# Patient Record
Sex: Male | Born: 1955 | Race: White | Hispanic: No | Marital: Married | Smoking: Current every day smoker
Health system: Southern US, Community
[De-identification: ages and names within clinical notes are randomized; demographics above are authoritative.]

## PROBLEM LIST (undated history)

## (undated) DIAGNOSIS — M722 Plantar fascial fibromatosis: Secondary | ICD-10-CM

## (undated) DIAGNOSIS — I429 Cardiomyopathy, unspecified: Secondary | ICD-10-CM

## (undated) DIAGNOSIS — I499 Cardiac arrhythmia, unspecified: Secondary | ICD-10-CM

## (undated) DIAGNOSIS — I4891 Unspecified atrial fibrillation: Secondary | ICD-10-CM

## (undated) DIAGNOSIS — E785 Hyperlipidemia, unspecified: Secondary | ICD-10-CM

## (undated) DIAGNOSIS — I10 Essential (primary) hypertension: Secondary | ICD-10-CM

## (undated) HISTORY — DX: Essential (primary) hypertension: I10

## (undated) HISTORY — PX: CARDIOVERSION: SHX1299

## (undated) HISTORY — DX: Hyperlipidemia, unspecified: E78.5

## (undated) HISTORY — PX: FOOT SURGERY: SHX648

## (undated) HISTORY — DX: Plantar fascial fibromatosis: M72.2

## (undated) HISTORY — DX: Cardiomyopathy, unspecified: I42.9

## (undated) HISTORY — PX: APPENDECTOMY: SHX54

## (undated) HISTORY — DX: Unspecified atrial fibrillation: I48.91

---

## 2015-04-12 ENCOUNTER — Other Ambulatory Visit: Payer: Self-pay | Admitting: Gastroenterology

## 2015-04-12 ENCOUNTER — Encounter: Payer: Self-pay | Admitting: Cardiology

## 2015-04-12 ENCOUNTER — Ambulatory Visit (INDEPENDENT_AMBULATORY_CARE_PROVIDER_SITE_OTHER): Payer: Managed Care, Other (non HMO) | Admitting: Cardiology

## 2015-04-12 VITALS — BP 110/70 | HR 54 | Ht 71.0 in | Wt 194.1 lb

## 2015-04-12 DIAGNOSIS — I480 Paroxysmal atrial fibrillation: Secondary | ICD-10-CM | POA: Insufficient documentation

## 2015-04-12 DIAGNOSIS — E785 Hyperlipidemia, unspecified: Secondary | ICD-10-CM | POA: Diagnosis not present

## 2015-04-12 DIAGNOSIS — Z720 Tobacco use: Secondary | ICD-10-CM | POA: Insufficient documentation

## 2015-04-12 DIAGNOSIS — Z8679 Personal history of other diseases of the circulatory system: Secondary | ICD-10-CM | POA: Insufficient documentation

## 2015-04-12 NOTE — Progress Notes (Signed)
Cardiology Office Note   Date:  04/12/2015   ID:  Benjamin Myers, DOB August 26, 1956, MRN [id number]  PCP:  Wenda Low, MD  Cardiologist:   Candee Furbish, MD   Evaluation of A. fib    History of Present Illness: Benjamin Myers is a 59 y.o. 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 He has been on Tikosyn and Xarelto since 2013. Also has hypertension, hyperlipidemia. Current smoker. Works at [employer]. He sets a boat shows etc. Born in [place].   They have 3 dogs, Beagle, Atlantic, bordercollie who is deaf.  Since 2013 he has been on Xarelto and Tikosyn. He has been doing very well. He has not had another episode of atrial fibrillation. He does smoke however.  When he had his episode of atrial fibrillation 2013 his heart rate was 200 beats a minute. He was in the hospital for 5 days. He finally ended up getting a cardioversion electrically.    Past Medical History  Diagnosis Date  . HTN (hypertension)   . Hyperlipidemia   . A-fib   . Cardiomyopathy   . Plantar fasciitis   . A-fib     Past Surgical History  Procedure Laterality Date  . Cardioversion    . Foot surgery    . Appendectomy       Current Outpatient Prescriptions  Medication Sig Dispense Refill  . carvedilol (COREG) 12.5 MG tablet Take 12.5 mg by mouth 2 (two) times daily with a meal.    . dofetilide (TIKOSYN) 250 MCG capsule Take 250 mcg by mouth 2 (two) times daily.    Marland Kitchen lisinopril (PRINIVIL,ZESTRIL) 10 MG tablet Take 10 mg by mouth daily.    . rivaroxaban (XARELTO) 20 MG TABS tablet Take 20 mg by mouth daily with supper.    . simvastatin (ZOCOR) 20 MG tablet Take 20 mg by mouth daily.     No current facility-administered medications for this visit.     Allergies:   Review of patient's allergies indicates no known allergies.    Social History:  The patient  reports that he has been smoking.  He does not have any smokeless tobacco history on file.   Family History:  The patient's family history includes Hypertension in his mother.    ROS:  Please see the history of present illness.   Otherwise, review of systems are positive for none.   All other systems are reviewed and negative.    PHYSICAL EXAM: VS:  BP 110/70 mmHg  Pulse 54  Ht 5\' 11"  (1.803 m)  Wt 194 lb 1.9 oz (88.052 kg)  BMI 27.09 kg/m2 , BMI Body mass index is 27.09 kg/(m^2). GEN: Well nourished, well developed, in no acute distress HEENT: normal Neck: no JVD, carotid bruits, or masses Cardiac: RRR; no murmurs, rubs, or gallops,no edema  Respiratory:  clear to auscultation bilaterally, normal work of breathing GI: soft, nontender, nondistended, + BS MS: no deformity or atrophy Skin: warm and dry, no rash Neuro:  Strength and sensation are intact Psych: euthymic mood, full affect   EKG:  EKG is ordered today. The ekg ordered today demonstrates 04/12/15-sinus bradycardia rate 54 with no other abnormalities.  Labs: Hemoglobin 14.1, platelets 244, sodium 137, potassium 4.2, ALT 13, creatinine 0.76. Calculated LDL 66, triglycerides 118, HDL 42  Recent Labs: No results found for requested labs within last 365 days.    Lipid Panel No results found for: CHOL, TRIG, HDL, CHOLHDL, VLDL, LDLCALC, LDLDIRECT    Wt Readings from Last 3 Encounters:  04/12/15 194 lb 1.9 oz (88.052 kg)      Other studies Reviewed: Additional studies/ records that were reviewed today include: Office notes, lab work, EKG. Review of the above records demonstrates: As above   ASSESSMENT AND PLAN:  1.  Paroxysmal atrial fibrillation-CHADS-VASc 1 (HTN). He has been on Xarelto for 3 years. We will continue with this. I explained to him the option of anticoagulation or no anticoagulation.  Risks and benefits of bleeding, stroke. He has had no bleeding episodes. He gets his Xarelto for free.  2. Previous tachycardia-induced cardiomyopathy-ejection fraction improved from 20% to 60%. Continue with current medications.  3. Hyperlipidemia-LDL in the 60s. Continue simvastatin.  4. Smoking-encourage tobacco cessation. He was able to quit after the hospitalization. Many of his coworkers smoke. Challenging.   Current medicines are reviewed at length with the patient today.  The patient does not have concerns regarding medicines.  The following changes have been made:  no change  Labs/ tests ordered today include: none  No orders of the defined types were placed in this encounter.     Disposition:   FU with Hydie Langan in 6 months  Signed, Candee Furbish, MD  04/12/2015 9:44 AM    [facility] [place], [place], [place]  [address] Phone: [phone]; Fax: [phone]

## 2015-04-12 NOTE — Patient Instructions (Signed)
Medication Instructions:  Your physician recommends that you continue on your current medications as directed. Please refer to the Current Medication list given to you today.  Follow-Up: Follow up in 6 months with Dr. Skains.  You will receive a letter in the mail 2 months before you are due.  Please call us when you receive this letter to schedule your follow up appointment.  Thank you for choosing [facility]!!     

## 2015-04-27 ENCOUNTER — Telehealth: Payer: Self-pay | Admitting: Cardiology

## 2015-04-27 NOTE — Telephone Encounter (Signed)
ROI faxed to [facility], records rec back placed in chart rep bin.

## 2015-07-20 ENCOUNTER — Encounter (HOSPITAL_COMMUNITY): Payer: Self-pay | Admitting: *Deleted

## 2015-08-06 ENCOUNTER — Encounter (HOSPITAL_COMMUNITY)
Admission: RE | Disposition: A | Discharge status: Home / Self Care | Payer: Self-pay | Source: Ambulatory Visit | Attending: Gastroenterology

## 2015-08-06 ENCOUNTER — Ambulatory Visit (HOSPITAL_COMMUNITY): Payer: Managed Care, Other (non HMO) | Admitting: Certified Registered Nurse Anesthetist

## 2015-08-06 ENCOUNTER — Ambulatory Visit (HOSPITAL_COMMUNITY)
Admission: RE | Admit: 2015-08-06 | Discharge: 2015-08-06 | Disposition: A | Discharge status: Home / Self Care | Payer: Managed Care, Other (non HMO) | Source: Ambulatory Visit | Attending: Gastroenterology | Admitting: Gastroenterology

## 2015-08-06 ENCOUNTER — Encounter (HOSPITAL_COMMUNITY): Payer: Self-pay

## 2015-08-06 DIAGNOSIS — I429 Cardiomyopathy, unspecified: Secondary | ICD-10-CM | POA: Diagnosis not present

## 2015-08-06 DIAGNOSIS — D128 Benign neoplasm of rectum: Secondary | ICD-10-CM | POA: Insufficient documentation

## 2015-08-06 DIAGNOSIS — I482 Chronic atrial fibrillation: Secondary | ICD-10-CM | POA: Insufficient documentation

## 2015-08-06 DIAGNOSIS — Z79899 Other long term (current) drug therapy: Secondary | ICD-10-CM | POA: Diagnosis not present

## 2015-08-06 DIAGNOSIS — Z1211 Encounter for screening for malignant neoplasm of colon: Secondary | ICD-10-CM | POA: Diagnosis not present

## 2015-08-06 DIAGNOSIS — E780 Pure hypercholesterolemia: Secondary | ICD-10-CM | POA: Diagnosis not present

## 2015-08-06 DIAGNOSIS — F17200 Nicotine dependence, unspecified, uncomplicated: Secondary | ICD-10-CM | POA: Insufficient documentation

## 2015-08-06 DIAGNOSIS — Z7901 Long term (current) use of anticoagulants: Secondary | ICD-10-CM | POA: Diagnosis not present

## 2015-08-06 DIAGNOSIS — I10 Essential (primary) hypertension: Secondary | ICD-10-CM | POA: Insufficient documentation

## 2015-08-06 HISTORY — DX: Cardiac arrhythmia, unspecified: I49.9

## 2015-08-06 HISTORY — PX: COLONOSCOPY WITH PROPOFOL: SHX5780

## 2015-08-06 SURGERY — COLONOSCOPY WITH PROPOFOL
Anesthesia: Monitor Anesthesia Care

## 2015-08-06 MED ORDER — LIDOCAINE HCL (CARDIAC) 20 MG/ML IV SOLN
INTRAVENOUS | Status: DC | PRN
  Administered 2015-08-06: 50 mg via INTRAVENOUS

## 2015-08-06 MED ORDER — LACTATED RINGERS IV SOLN
INTRAVENOUS | Status: DC
Start: 2015-08-06 — End: 2015-08-06
  Administered 2015-08-06: 09:00:00 via INTRAVENOUS

## 2015-08-06 MED ORDER — SODIUM CHLORIDE 0.9 % IV SOLN: INTRAVENOUS | Status: DC

## 2015-08-06 MED ORDER — PROPOFOL 10 MG/ML IV BOLUS
INTRAVENOUS | Status: AC
  Filled 2015-08-06: qty 20

## 2015-08-06 MED ORDER — LIDOCAINE HCL (CARDIAC) 20 MG/ML IV SOLN
INTRAVENOUS | Status: AC
  Filled 2015-08-06: qty 5

## 2015-08-06 MED ORDER — PROPOFOL 10 MG/ML IV BOLUS
INTRAVENOUS | Status: DC | PRN
  Administered 2015-08-06: 100 mg via INTRAVENOUS
  Administered 2015-08-06 (×2): 50 mg via INTRAVENOUS

## 2015-08-06 SURGICAL SUPPLY — 21 items

## 2015-08-06 NOTE — Op Note (Signed)
Procedure: Baseline screening colonoscopy. Xarelto anticoagulation therapy to treat atrial fibrillation. No family history of colon cancer.  Endoscopist: Earle Gell  Premedication: Propofol administered by anesthesia  Procedure: The patient was placed in the left lateral decubitus position. Anal inspection and digital rectal exam were normal. Pentax pediatric colonoscope was introduced into the rectum and advanced to the cecum. A normal-appearing appendiceal orifice was identified. A normal-appearing ileocecal valve was intubated and the terminal ileum inspected. Colonic preparation for the exam today was good. Withdrawal time was 13 minutes  Rectum. From the mid rectum, a 5 mm sessile polyp was removed with the cold snare and 10 Endo Clip was applied to the polypectomy to site to prevent bleeding. Retroflexed view of the distal rectum was normal  Sigmoid colon and descending colon. Normal  Splenic flexure. Normal  Transverse colon. Normal  Hepatic flexure. Normal  Ascending colon. Normal.  Cecum and ileocecal valve. Normal.  Terminal ileum. Normal  Assessment: From the mid rectum, a 5 mm sessile polyp was removed with the cold snare and an Endo Clip applied to the polypectomy site to prevent bleeding. Otherwise normal colonoscopy  Recommendation: If the rectal polyp returns adenomatous pathologically, schedule repeat colonoscopy in 5 years

## 2015-08-06 NOTE — Transfer of Care (Signed)
Immediate Anesthesia Transfer of Care Note  Patient: Benjamin Myers  Procedure(s) Performed: Procedure(s): COLONOSCOPY WITH PROPOFOL (N/A)  Patient Location: PACU  Anesthesia Type:MAC  Level of Consciousness: awake, alert  and oriented  Airway & Oxygen Therapy: Patient Spontanous Breathing and Patient connected to face mask oxygen  Post-op Assessment: Report given to RN and Post -op Vital signs reviewed and stable  Post vital signs: Reviewed and stable  Last Vitals:  Filed Vitals:   08/06/15 0905  BP: 175/84  Pulse: 58  Temp: 36.7 C  Resp: 14    Complications: No apparent anesthesia complications

## 2015-08-06 NOTE — Discharge Instructions (Signed)

## 2015-08-06 NOTE — Anesthesia Preprocedure Evaluation (Addendum)
Anesthesia Evaluation  Patient identified by MRN, date of birth, ID band Patient awake    Reviewed: Allergy & Precautions, H&P , NPO status , Patient's Chart, lab work & pertinent test results, reviewed documented beta blocker date and time   Airway Mallampati: II  TM Distance: >3 FB Neck ROM: full    Dental  (+) Dental Advisory Given, Poor Dentition Issues with top left front lateral teeth:   Pulmonary Current Smoker,    Pulmonary exam normal breath sounds clear to auscultation       Cardiovascular Exercise Tolerance: Good hypertension, Pt. on home beta blockers and Pt. on medications Normal cardiovascular exam+ dysrhythmias Atrial Fibrillation  Rhythm:regular Rate:Normal  History cardiomyopathy   Neuro/Psych negative neurological ROS  negative psych ROS   GI/Hepatic negative GI ROS, Neg liver ROS,   Endo/Other  negative endocrine ROS  Renal/GU negative Renal ROS  negative genitourinary   Musculoskeletal   Abdominal   Peds  Hematology negative hematology ROS (+)   Anesthesia Other Findings   Reproductive/Obstetrics negative OB ROS                            Anesthesia Physical Anesthesia Plan  ASA: III  Anesthesia Plan: MAC   Post-op Pain Management:    Induction:   Airway Management Planned:   Additional Equipment:   Intra-op Plan:   Post-operative Plan:   Informed Consent: I have reviewed the patients History and Physical, chart, labs and discussed the procedure including the risks, benefits and alternatives for the proposed anesthesia with the patient or authorized representative who has indicated his/her understanding and acceptance.   Dental Advisory Given  Plan Discussed with: CRNA and Surgeon  Anesthesia Plan Comments:         Anesthesia Quick Evaluation

## 2015-08-06 NOTE — H&P (Signed)
  Procedure: Baseline screening colonoscopy. Chronic anticoagulation with xarelto to treat chronic atrial fibrillation  History: The patient is a 59 year old male born 1956-03-31. He is scheduled to undergo his first screening colonoscopy with polypectomy to prevent colon cancer. He stopped taking xarelto 4 days ago.  Past medical history: Hypertension. Hypercholesterolemia. Paroxysmal atrial fibrillation. Cardiomyopathy. 2015 left ventricular ejection fraction 60% by cardiac echo. Foot surgery for plantar fascia Ida's. Appendectomy.  Medication allergies: None  Exam: The patient is alert and lying comfortably on the endoscopy stretcher. Abdomen is soft and nontender to palpation. Lungs are clear to auscultation. Cardiac exam reveals a regular rhythm.  Plan: Proceed with baseline screening colonoscopy

## 2015-08-06 NOTE — Anesthesia Postprocedure Evaluation (Signed)
  Anesthesia Post-op Note  Patient: Benjamin Myers  Procedure(s) Performed: Procedure(s) (LRB): COLONOSCOPY WITH PROPOFOL (N/A)  Patient Location: PACU  Anesthesia Type: MAC  Level of Consciousness: awake and alert   Airway and Oxygen Therapy: Patient Spontanous Breathing  Post-op Pain: mild  Post-op Assessment: Post-op Vital signs reviewed, Patient's Cardiovascular Status Stable, Respiratory Function Stable, Patent Airway and No signs of Nausea or vomiting  Last Vitals:  Filed Vitals:   08/06/15 1052  BP: 151/75  Pulse:   Temp:   Resp: 15    Post-op Vital Signs: stable   Complications: No apparent anesthesia complications

## 2015-08-07 ENCOUNTER — Encounter (HOSPITAL_COMMUNITY): Payer: Self-pay | Admitting: Gastroenterology

## 2015-10-16 ENCOUNTER — Ambulatory Visit: Payer: Managed Care, Other (non HMO) | Admitting: Cardiology

## 2017-04-22 ENCOUNTER — Other Ambulatory Visit: Payer: Self-pay | Admitting: Internal Medicine

## 2017-04-22 ENCOUNTER — Ambulatory Visit
Admission: RE | Admit: 2017-04-22 | Discharge: 2017-04-22 | Disposition: A | Discharge status: Home / Self Care | Payer: Managed Care, Other (non HMO) | Source: Ambulatory Visit | Attending: Internal Medicine | Admitting: Internal Medicine

## 2017-04-22 DIAGNOSIS — F17210 Nicotine dependence, cigarettes, uncomplicated: Secondary | ICD-10-CM

## 2018-06-10 IMAGING — DX DG CHEST 2V
2 series · 2 of 2 positions shown · non-contrast
Comparison: None.

CLINICAL DATA: Smoker

EXAM:
CHEST  2 VIEW

[dg chest 2 view (1 of 2)]
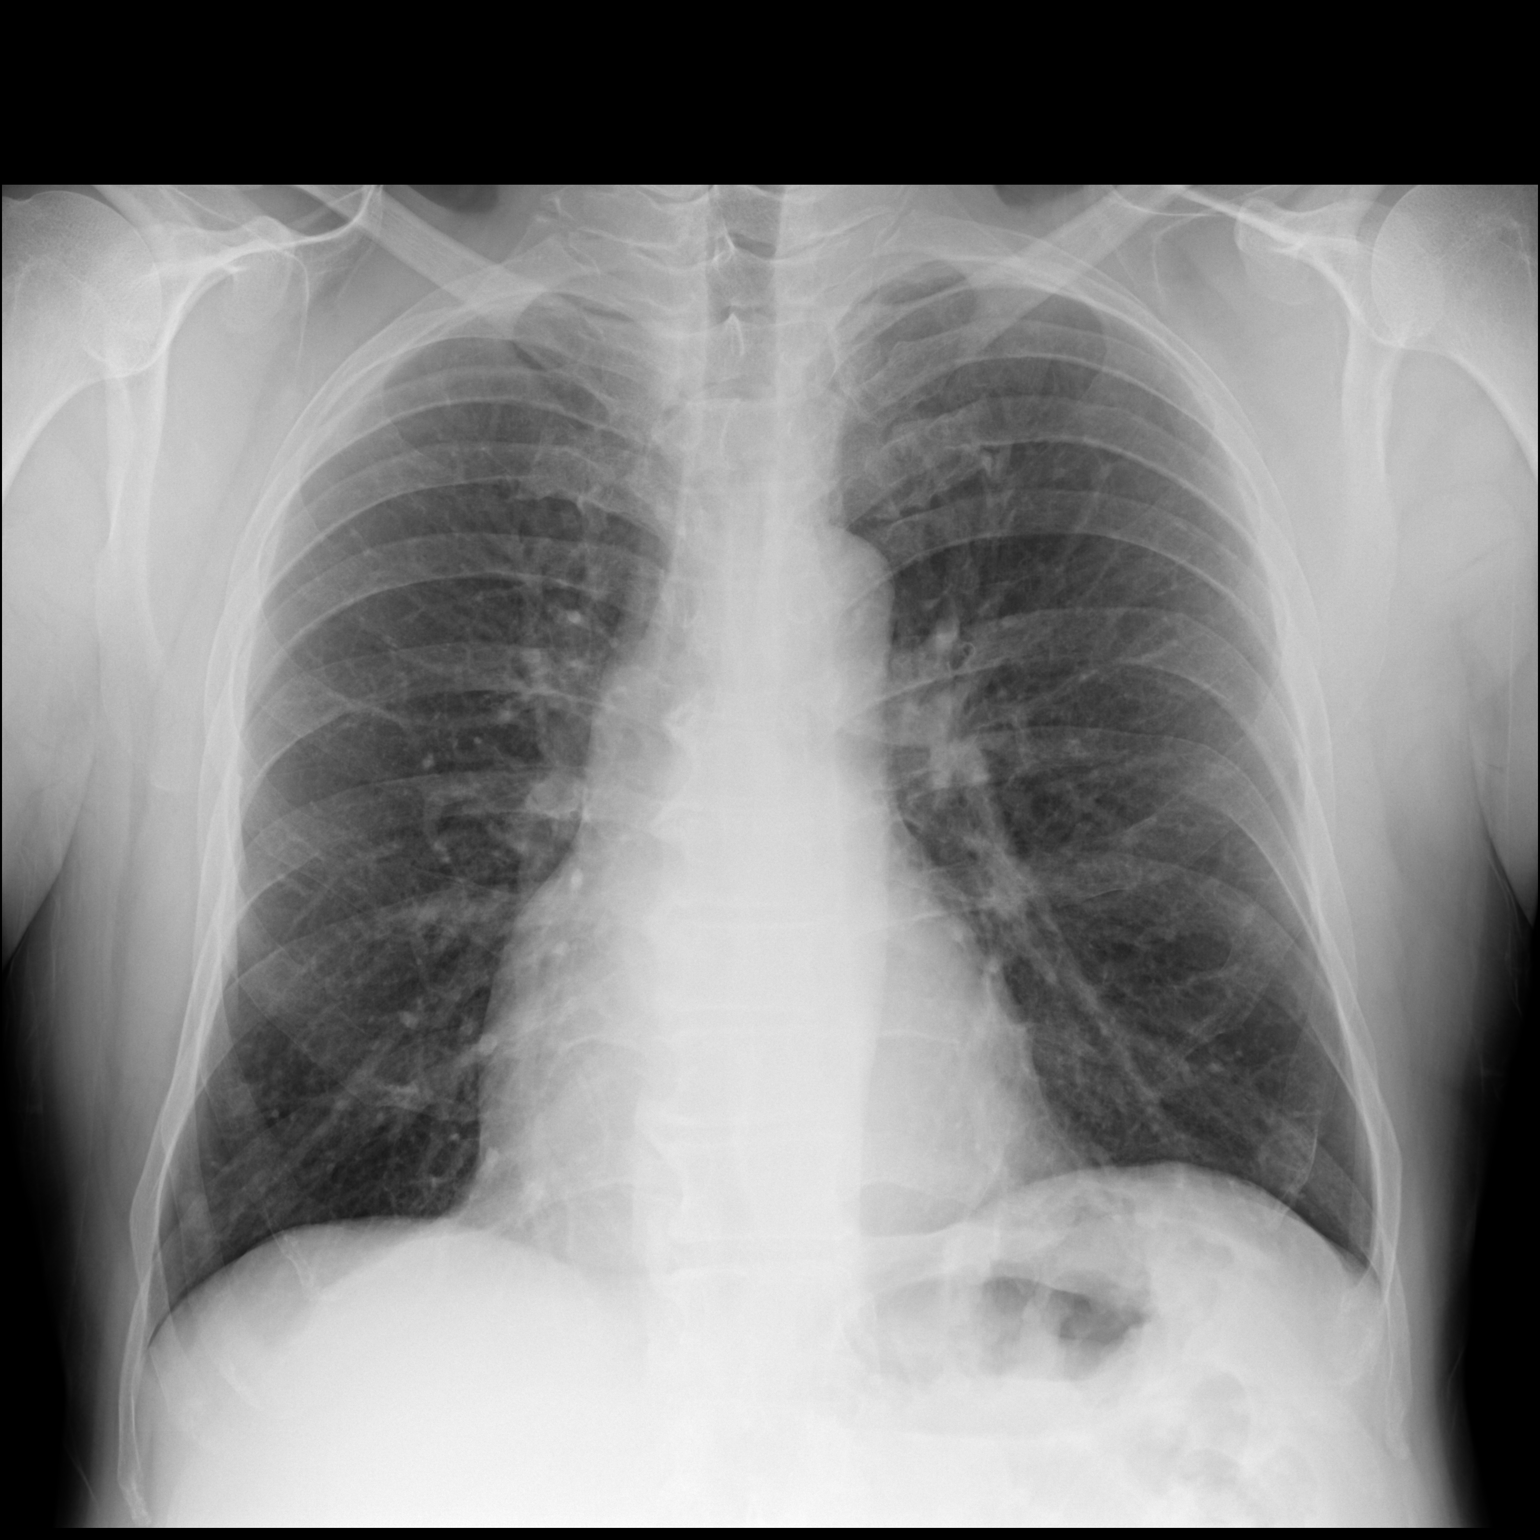

[dg chest 2 view (2 of 2)]
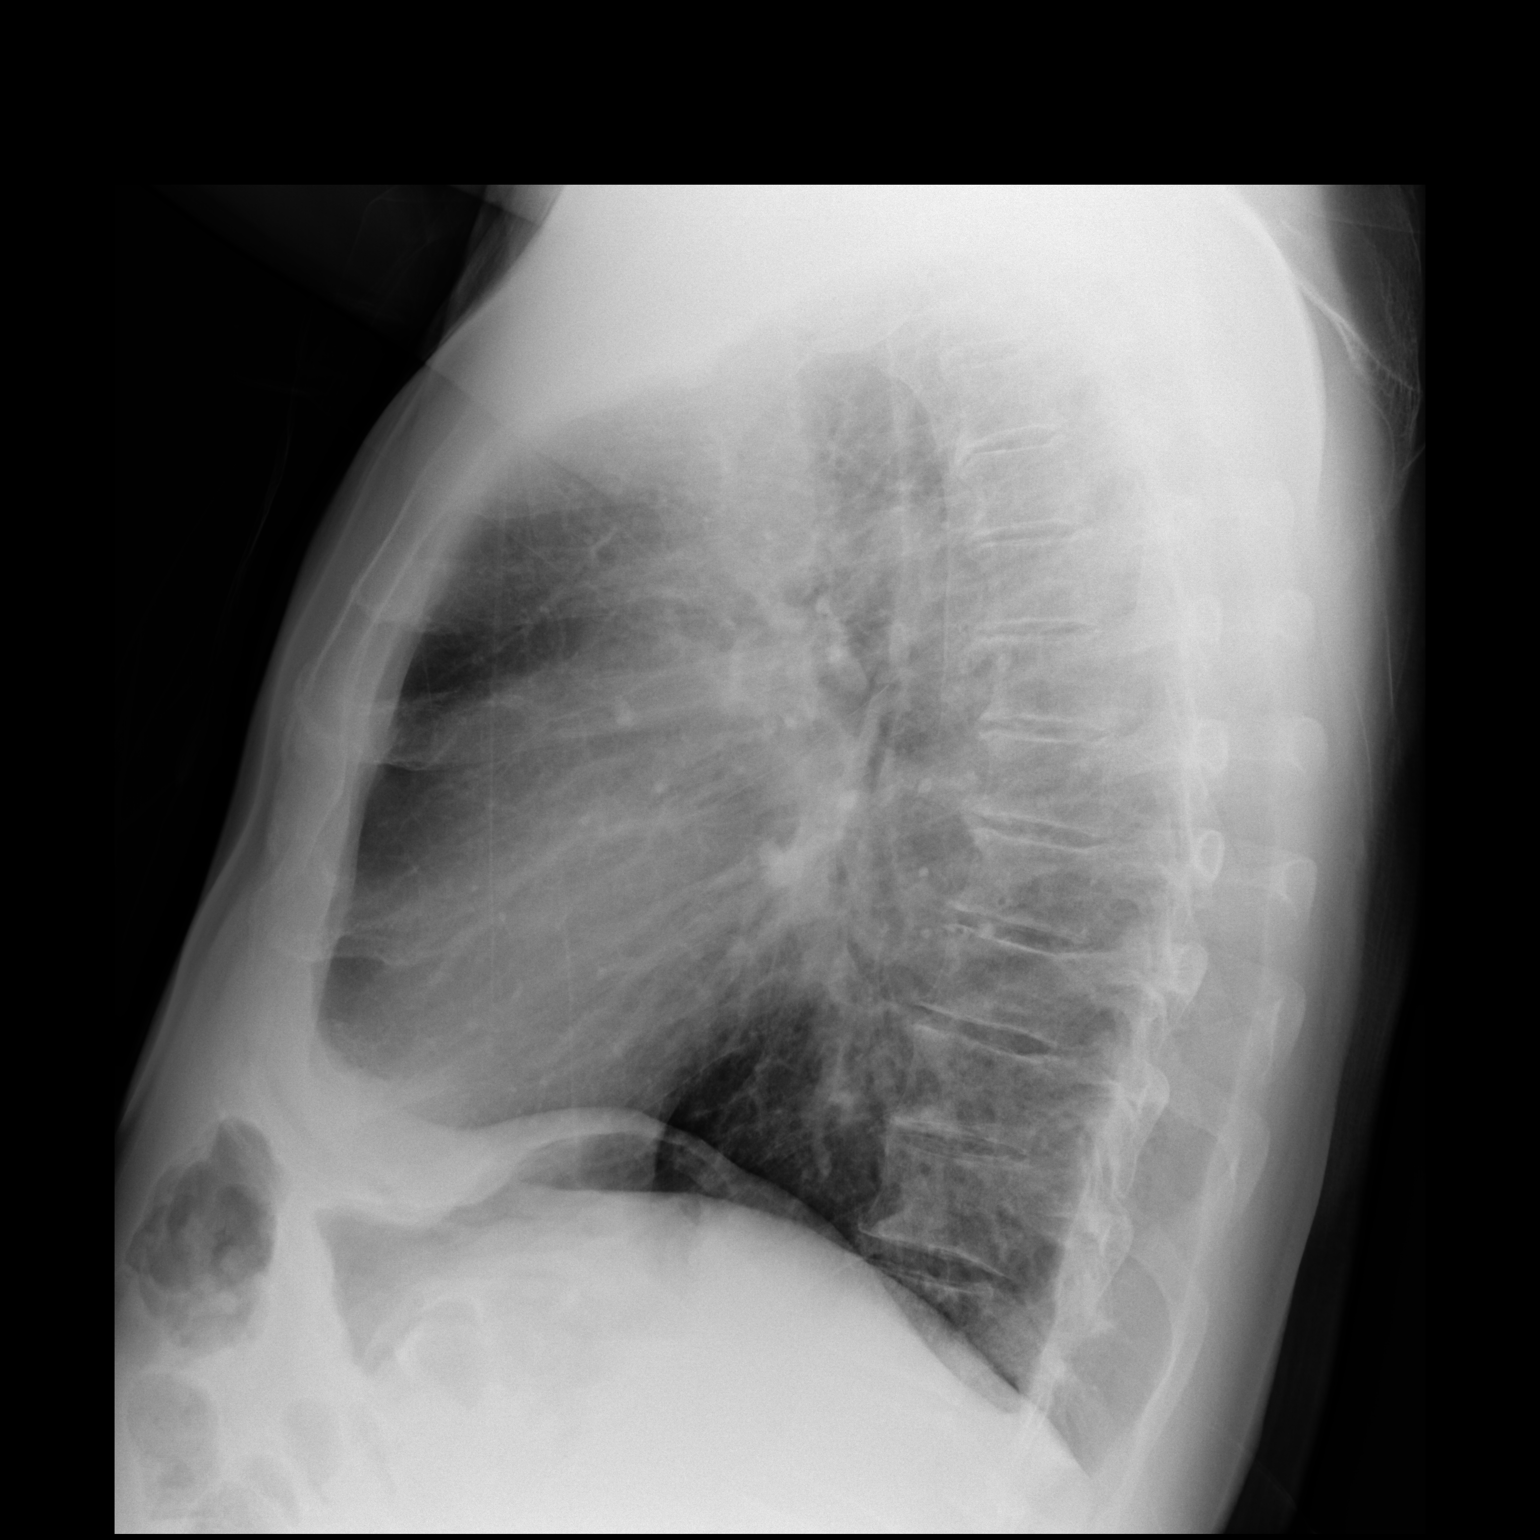

[2 of 2 positions shown; findings below may reference images not displayed]

FINDINGS: Mild hyperinflation. Heart and mediastinal contours are within
normal limits. No focal opacities or effusions. No acute bony
abnormality.
IMPRESSION: Mild hyperinflation.  No active cardiopulmonary disease.

## 2023-07-24 DIAGNOSIS — H40053 Ocular hypertension, bilateral: Secondary | ICD-10-CM | POA: Diagnosis not present

## 2023-07-24 DIAGNOSIS — H43393 Other vitreous opacities, bilateral: Secondary | ICD-10-CM | POA: Diagnosis not present

## 2023-07-24 DIAGNOSIS — H40033 Anatomical narrow angle, bilateral: Secondary | ICD-10-CM | POA: Diagnosis not present

## 2023-07-24 DIAGNOSIS — H25043 Posterior subcapsular polar age-related cataract, bilateral: Secondary | ICD-10-CM | POA: Diagnosis not present

## 2023-07-24 DIAGNOSIS — H25813 Combined forms of age-related cataract, bilateral: Secondary | ICD-10-CM | POA: Diagnosis not present

## 2023-08-24 DIAGNOSIS — H402223 Chronic angle-closure glaucoma, left eye, severe stage: Secondary | ICD-10-CM | POA: Diagnosis not present

## 2023-08-24 DIAGNOSIS — H40032 Anatomical narrow angle, left eye: Secondary | ICD-10-CM | POA: Diagnosis not present

## 2023-08-24 DIAGNOSIS — H2513 Age-related nuclear cataract, bilateral: Secondary | ICD-10-CM | POA: Diagnosis not present

## 2023-08-24 DIAGNOSIS — H40033 Anatomical narrow angle, bilateral: Secondary | ICD-10-CM | POA: Diagnosis not present

## 2023-08-24 DIAGNOSIS — H402233 Chronic angle-closure glaucoma, bilateral, severe stage: Secondary | ICD-10-CM | POA: Diagnosis not present

## 2023-08-24 DIAGNOSIS — H25013 Cortical age-related cataract, bilateral: Secondary | ICD-10-CM | POA: Diagnosis not present

## 2023-09-02 DIAGNOSIS — I480 Paroxysmal atrial fibrillation: Secondary | ICD-10-CM | POA: Diagnosis not present

## 2023-09-02 DIAGNOSIS — D6869 Other thrombophilia: Secondary | ICD-10-CM | POA: Diagnosis not present

## 2023-09-02 DIAGNOSIS — Z125 Encounter for screening for malignant neoplasm of prostate: Secondary | ICD-10-CM | POA: Diagnosis not present

## 2023-09-02 DIAGNOSIS — Z1389 Encounter for screening for other disorder: Secondary | ICD-10-CM | POA: Diagnosis not present

## 2023-09-02 DIAGNOSIS — R7303 Prediabetes: Secondary | ICD-10-CM | POA: Diagnosis not present

## 2023-09-02 DIAGNOSIS — I429 Cardiomyopathy, unspecified: Secondary | ICD-10-CM | POA: Diagnosis not present

## 2023-09-02 DIAGNOSIS — Z0000 Encounter for general adult medical examination without abnormal findings: Secondary | ICD-10-CM | POA: Diagnosis not present

## 2023-09-02 DIAGNOSIS — I10 Essential (primary) hypertension: Secondary | ICD-10-CM | POA: Diagnosis not present

## 2023-09-02 DIAGNOSIS — E7800 Pure hypercholesterolemia, unspecified: Secondary | ICD-10-CM | POA: Diagnosis not present

## 2023-09-02 DIAGNOSIS — F17210 Nicotine dependence, cigarettes, uncomplicated: Secondary | ICD-10-CM | POA: Diagnosis not present

## 2023-09-14 DIAGNOSIS — H2513 Age-related nuclear cataract, bilateral: Secondary | ICD-10-CM | POA: Diagnosis not present

## 2023-09-14 DIAGNOSIS — H402233 Chronic angle-closure glaucoma, bilateral, severe stage: Secondary | ICD-10-CM | POA: Diagnosis not present

## 2023-09-14 DIAGNOSIS — H40031 Anatomical narrow angle, right eye: Secondary | ICD-10-CM | POA: Diagnosis not present

## 2023-09-21 DIAGNOSIS — H2513 Age-related nuclear cataract, bilateral: Secondary | ICD-10-CM | POA: Diagnosis not present

## 2023-09-22 DIAGNOSIS — R9431 Abnormal electrocardiogram [ECG] [EKG]: Secondary | ICD-10-CM | POA: Diagnosis not present

## 2023-09-22 DIAGNOSIS — I493 Ventricular premature depolarization: Secondary | ICD-10-CM | POA: Diagnosis not present

## 2023-09-30 DIAGNOSIS — R9431 Abnormal electrocardiogram [ECG] [EKG]: Secondary | ICD-10-CM | POA: Diagnosis not present

## 2023-09-30 DIAGNOSIS — I493 Ventricular premature depolarization: Secondary | ICD-10-CM | POA: Diagnosis not present

## 2023-10-01 DIAGNOSIS — Z1212 Encounter for screening for malignant neoplasm of rectum: Secondary | ICD-10-CM | POA: Diagnosis not present

## 2023-10-01 DIAGNOSIS — Z1211 Encounter for screening for malignant neoplasm of colon: Secondary | ICD-10-CM | POA: Diagnosis not present

## 2023-10-09 LAB — EXTERNAL GENERIC LAB PROCEDURE: COLOGUARD: POSITIVE — AB

## 2023-10-14 DIAGNOSIS — I493 Ventricular premature depolarization: Secondary | ICD-10-CM | POA: Diagnosis not present

## 2023-10-14 DIAGNOSIS — R9431 Abnormal electrocardiogram [ECG] [EKG]: Secondary | ICD-10-CM | POA: Diagnosis not present

## 2023-11-09 DIAGNOSIS — I517 Cardiomegaly: Secondary | ICD-10-CM | POA: Diagnosis not present

## 2024-02-04 DIAGNOSIS — H2512 Age-related nuclear cataract, left eye: Secondary | ICD-10-CM | POA: Diagnosis not present

## 2024-02-04 DIAGNOSIS — H401123 Primary open-angle glaucoma, left eye, severe stage: Secondary | ICD-10-CM | POA: Diagnosis not present

## 2024-02-04 DIAGNOSIS — H401113 Primary open-angle glaucoma, right eye, severe stage: Secondary | ICD-10-CM | POA: Diagnosis not present

## 2024-03-02 ENCOUNTER — Other Ambulatory Visit: Payer: Self-pay | Admitting: Internal Medicine

## 2024-03-02 DIAGNOSIS — E7800 Pure hypercholesterolemia, unspecified: Secondary | ICD-10-CM | POA: Diagnosis not present

## 2024-03-02 DIAGNOSIS — H409 Unspecified glaucoma: Secondary | ICD-10-CM | POA: Diagnosis not present

## 2024-03-02 DIAGNOSIS — F17210 Nicotine dependence, cigarettes, uncomplicated: Secondary | ICD-10-CM

## 2024-03-02 DIAGNOSIS — R7303 Prediabetes: Secondary | ICD-10-CM | POA: Diagnosis not present

## 2024-03-02 DIAGNOSIS — I480 Paroxysmal atrial fibrillation: Secondary | ICD-10-CM | POA: Diagnosis not present

## 2024-03-02 DIAGNOSIS — I429 Cardiomyopathy, unspecified: Secondary | ICD-10-CM | POA: Diagnosis not present

## 2024-03-02 DIAGNOSIS — D6869 Other thrombophilia: Secondary | ICD-10-CM | POA: Diagnosis not present

## 2024-03-02 DIAGNOSIS — I10 Essential (primary) hypertension: Secondary | ICD-10-CM | POA: Diagnosis not present

## 2024-03-02 DIAGNOSIS — Z1211 Encounter for screening for malignant neoplasm of colon: Secondary | ICD-10-CM | POA: Diagnosis not present

## 2024-03-15 ENCOUNTER — Ambulatory Visit
Admission: RE | Admit: 2024-03-15 | Discharge: 2024-03-15 | Disposition: A | Discharge status: Home / Self Care | Source: Ambulatory Visit | Attending: Internal Medicine | Admitting: Internal Medicine

## 2024-03-15 DIAGNOSIS — Z122 Encounter for screening for malignant neoplasm of respiratory organs: Secondary | ICD-10-CM | POA: Diagnosis not present

## 2024-03-15 DIAGNOSIS — F17210 Nicotine dependence, cigarettes, uncomplicated: Secondary | ICD-10-CM

## 2024-03-15 DIAGNOSIS — Z87891 Personal history of nicotine dependence: Secondary | ICD-10-CM | POA: Diagnosis not present

## 2024-03-21 DIAGNOSIS — I480 Paroxysmal atrial fibrillation: Secondary | ICD-10-CM | POA: Diagnosis not present

## 2024-03-21 DIAGNOSIS — E785 Hyperlipidemia, unspecified: Secondary | ICD-10-CM | POA: Diagnosis not present

## 2024-03-21 DIAGNOSIS — Z8679 Personal history of other diseases of the circulatory system: Secondary | ICD-10-CM | POA: Diagnosis not present

## 2024-03-21 DIAGNOSIS — Z1330 Encounter for screening examination for mental health and behavioral disorders, unspecified: Secondary | ICD-10-CM | POA: Diagnosis not present

## 2024-03-21 DIAGNOSIS — I493 Ventricular premature depolarization: Secondary | ICD-10-CM | POA: Diagnosis not present

## 2024-04-12 ENCOUNTER — Other Ambulatory Visit: Payer: Self-pay | Admitting: Internal Medicine

## 2024-04-12 DIAGNOSIS — R911 Solitary pulmonary nodule: Secondary | ICD-10-CM

## 2024-06-29 ENCOUNTER — Ambulatory Visit
Admission: RE | Admit: 2024-06-29 | Discharge: 2024-06-29 | Disposition: A | Discharge status: Home / Self Care | Source: Ambulatory Visit | Attending: Internal Medicine | Admitting: Internal Medicine

## 2024-06-29 DIAGNOSIS — R911 Solitary pulmonary nodule: Secondary | ICD-10-CM

## 2024-09-06 DIAGNOSIS — Z0000 Encounter for general adult medical examination without abnormal findings: Secondary | ICD-10-CM | POA: Diagnosis not present

## 2024-09-06 DIAGNOSIS — R7303 Prediabetes: Secondary | ICD-10-CM | POA: Diagnosis not present

## 2024-09-06 DIAGNOSIS — E7800 Pure hypercholesterolemia, unspecified: Secondary | ICD-10-CM | POA: Diagnosis not present

## 2024-09-06 DIAGNOSIS — I10 Essential (primary) hypertension: Secondary | ICD-10-CM | POA: Diagnosis not present
